# Patient Record
Sex: Female | Born: 1995 | Race: Black or African American | Hispanic: No | Marital: Single | Smoking: Never smoker
Health system: Southern US, Community
[De-identification: ages and names within clinical notes are randomized; demographics above are authoritative.]

---

## 2018-03-02 ENCOUNTER — Encounter (HOSPITAL_COMMUNITY): Payer: Self-pay

## 2018-03-02 ENCOUNTER — Emergency Department (HOSPITAL_COMMUNITY): Payer: Self-pay

## 2018-03-02 ENCOUNTER — Emergency Department (HOSPITAL_COMMUNITY)
Admission: EM | Admit: 2018-03-02 | Discharge: 2018-03-03 | Disposition: A | Discharge status: Home / Self Care | Payer: Self-pay | Attending: Emergency Medicine | Admitting: Emergency Medicine

## 2018-03-02 DIAGNOSIS — R0602 Shortness of breath: Secondary | ICD-10-CM | POA: Insufficient documentation

## 2018-03-02 LAB — CBC WITH DIFFERENTIAL/PLATELET
Abs Immature Granulocytes: 0.05 10*3/uL (ref 0.00–0.07)
Basophils Absolute: 0.1 10*3/uL (ref 0.0–0.1)
Basophils Relative: 0 %
Eosinophils Absolute: 0 10*3/uL (ref 0.0–0.5)
Eosinophils Relative: 0 %
HCT: 41.2 % (ref 36.0–46.0)
Hemoglobin: 13.5 g/dL (ref 12.0–15.0)
Immature Granulocytes: 0 %
Lymphocytes Relative: 21 %
Lymphs Abs: 2.4 10*3/uL (ref 0.7–4.0)
MCH: 29.5 pg (ref 26.0–34.0)
MCHC: 32.8 g/dL (ref 30.0–36.0)
MCV: 90.2 fL (ref 80.0–100.0)
MONO ABS: 0.6 10*3/uL (ref 0.1–1.0)
MONOS PCT: 6 %
Neutro Abs: 8.1 10*3/uL — ABNORMAL HIGH (ref 1.7–7.7)
Neutrophils Relative %: 73 %
Platelets: 194 10*3/uL (ref 150–400)
RBC: 4.57 MIL/uL (ref 3.87–5.11)
RDW: 12.1 % (ref 11.5–15.5)
WBC: 11.2 10*3/uL — ABNORMAL HIGH (ref 4.0–10.5)
nRBC: 0 % (ref 0.0–0.2)

## 2018-03-02 LAB — D-DIMER, QUANTITATIVE: D-Dimer, Quant: 1.51 ug/mL-FEU — ABNORMAL HIGH (ref 0.00–0.50)

## 2018-03-02 LAB — POCT I-STAT TROPONIN I: Troponin i, poc: 0 ng/mL (ref 0.00–0.08)

## 2018-03-02 LAB — I-STAT BETA HCG BLOOD, ED (NOT ORDERABLE): I-stat hCG, quantitative: <5 m[IU]/mL (ref <5)

## 2018-03-02 MED ORDER — ALBUTEROL SULFATE (2.5 MG/3ML) 0.083% IN NEBU
5.0000 mg | INHALATION_SOLUTION | Freq: Once | RESPIRATORY_TRACT | Status: AC
  Administered 2018-03-02: 5 mg via RESPIRATORY_TRACT
  Filled 2018-03-02: qty 6

## 2018-03-02 NOTE — ED Provider Notes (Signed)
[facility]-EMERGENCY DEPT Provider Note   CSN: [id number] Arrival date & time: 03/02/18  2110     History   Chief Complaint No chief complaint on file.   HPI Nicole Herring is a 23 y.o. female.  The history is provided by the patient and medical records.     23 year old female with no significant past medical history presenting to the ED with shortness of breath.  States this has been intermittent for a few days now but worse today while she was at work.  States she just feels like she cannot catch her breath and begins pain which makes her very nervous.  She did have some hot flashes when this occurred today but has not had any noted fever.  She denies any cough, nasal congestion, sore throat, or other URI symptoms.  She denies any chest pain.  She has no known cardiac history.  She is not a smoker.  Patient does report donating plasma today, she has been doing this on a weekly basis for the past several weeks and has never had any issues.  She does feel like her shortness of breath is a little worse today after donating.  History reviewed. No pertinent past medical history.  There are no active problems to display for this patient.   History reviewed. No pertinent surgical history.   OB History   No obstetric history on file.      Home Medications    Prior to Admission medications   Not on File    Family History History reviewed. No pertinent family history.  Social History Social History   Tobacco Use  . Smoking status: Never Smoker  . Smokeless tobacco: Never Used  Substance Use Topics  . Alcohol use: Never    Frequency: Never  . Drug use: Never     Allergies   Patient has no allergy information on record.   Review of Systems Review of Systems  Respiratory: Positive for shortness of breath.   All other systems reviewed and are negative.    Physical Exam Updated Vital Signs BP 121/85 (BP Location: Left Arm)   Pulse  (!) 110   Temp 97.6 F (36.4 C) (Oral)   Resp 20   LMP 02/11/2018   SpO2 100%   Physical Exam Vitals signs and nursing note reviewed.  Constitutional:      Appearance: She is well-developed.  HENT:     Head: Normocephalic and atraumatic.  Eyes:     Conjunctiva/sclera: Conjunctivae normal.     Pupils: Pupils are equal, round, and reactive to light.  Neck:     Musculoskeletal: Normal range of motion.  Cardiovascular:     Rate and Rhythm: Normal rate and regular rhythm.     Heart sounds: Normal heart sounds.  Pulmonary:     Effort: Pulmonary effort is normal.     Breath sounds: Normal breath sounds. No decreased breath sounds, wheezing or rhonchi.  Abdominal:     General: Bowel sounds are normal.     Palpations: Abdomen is soft.  Musculoskeletal: Normal range of motion.  Skin:    General: Skin is warm and dry.  Neurological:     Mental Status: She is alert and oriented to person, place, and time.      ED Treatments / Results  Labs (all labs ordered are listed, but only abnormal results are displayed) Labs Reviewed  CBC WITH DIFFERENTIAL/PLATELET - Abnormal; Notable for the following components:      Result  Value   WBC 11.2 (*)    Neutro Abs 8.1 (*)    All other components within normal limits  BASIC METABOLIC PANEL - Abnormal; Notable for the following components:   Glucose, Bld 108 (*)    Calcium 8.5 (*)    All other components within normal limits  D-DIMER, QUANTITATIVE (NOT AT [facility]) - Abnormal; Notable for the following components:   D-Dimer, Quant 1.51 (*)    All other components within normal limits  I-STAT BETA HCG BLOOD, ED ([facility], [facility], [facility] ONLY)  I-STAT TROPONIN, ED  I-STAT BETA HCG BLOOD, ED (NOT ORDERABLE)  POCT I-STAT TROPONIN I    EKG None  Radiology Dg Chest 2 View  Result Date: 03/02/2018 CLINICAL DATA:  Shortness of breath.  Nonsmoker. EXAM: CHEST - 2 VIEW COMPARISON:  None. FINDINGS: The heart size and mediastinal contours are within normal  limits. Both lungs are clear. The visualized skeletal structures are unremarkable. IMPRESSION: No active cardiopulmonary disease. Electronically Signed   By: Burman NievesWilliam  Stevens M.D.   On: 03/02/2018 22:05   Ct Angio Chest Pe W And/or Wo Contrast  Result Date: 03/03/2018 CLINICAL DATA:  Shortness of breath tonight. Elevated D-dimer. Tachycardia. EXAM: CT ANGIOGRAPHY CHEST WITH CONTRAST TECHNIQUE: Multidetector CT imaging of the chest was performed using the standard protocol during bolus administration of intravenous contrast. Multiplanar CT image reconstructions and MIPs were obtained to evaluate the vascular anatomy. CONTRAST:  100mL ISOVUE-370 IOPAMIDOL (ISOVUE-370) INJECTION 76% COMPARISON:  None. FINDINGS: Cardiovascular: Good opacification of the central and segmental pulmonary arteries. No focal filling defects. No evidence of significant pulmonary embolus. Normal caliber thoracic aorta. No evidence of aortic dissection. Great vessel origins are patent. Normal heart size. No pericardial effusions. Mediastinum/Nodes: Esophagus is decompressed. No significant lymphadenopathy in the chest. Lungs/Pleura: Lungs are clear. No pleural effusion or pneumothorax. Upper Abdomen: No acute abnormalities. Musculoskeletal: No chest wall abnormality. No acute or significant osseous findings. Review of the MIP images confirms the above findings. IMPRESSION: 1. No evidence of significant pulmonary embolus. 2. No evidence of active pulmonary disease. Electronically Signed   By: Burman NievesWilliam  Stevens M.D.   On: 03/03/2018 01:24    Procedures Procedures (including critical care time)  Medications Ordered in ED Medications  sodium chloride (PF) 0.9 % injection (has no administration in time range)  iopamidol (ISOVUE-370) 76 % injection (has no administration in time range)  albuterol (PROVENTIL) (2.5 MG/3ML) 0.083% nebulizer solution 5 mg (5 mg Nebulization Given 03/02/18 2145)  iopamidol (ISOVUE-370) 76 % injection 100 mL  (100 mLs Intravenous Contrast Given 03/03/18 0053)     Initial Impression / Assessment and Plan / ED Course  I have reviewed the triage vital signs and the nursing notes.  Pertinent labs & imaging results that were available during my care of the patient were reviewed by me and considered in my medical decision making (see chart for details).  23 year old female here with shortness of breath.  Has been intermittent for a few days but worse today after donating plasma.  She has been donating on a weekly basis for a few weeks now but has not had any issues until now.  She is afebrile and nontoxic.  She is tachycardic on my exam but did just receive albuterol in triage.  She denies any chest pain here.  EKG with sinus tachycardia.  Chest x-ray was obtained and is negative.  Suspect this may be related to her plasma donation today.  Screening labs pending, will add d-dimer and troponin given  tachycardia as symptoms reportedly present prior to plasma donation but worse after.  Labs as above--CBC unremarkable.  Troponin is negative.  No electrolyte disturbance.  D-dimer is positive at 1.51.  CTA obtained and is negative for acute PE.  Patient still with intermittent tachycardia in the room but denies any current shortness of breath.  Remains without any chest pain.  Symptoms not concerning for ACS.  Feel she is stable for discharge home to follow-up closely with primary care doctor.  She can return here for any new or worsening symptoms.  Final Clinical Impressions(s) / ED Diagnoses   Final diagnoses:  Shortness of breath    ED Discharge Orders    None       Garlon Hatchet, PA-C 03/03/18 0226    Jacalyn Lefevre, MD 03/03/18 [phone]

## 2018-03-02 NOTE — ED Triage Notes (Signed)
Pt complains of being short of breath after getting to work tonight, she states she gave plasma yesterday and felt a little short of breath then but it went away  Pt denies an URI or coughing

## 2018-03-03 ENCOUNTER — Emergency Department (HOSPITAL_COMMUNITY): Payer: Self-pay

## 2018-03-03 ENCOUNTER — Encounter (HOSPITAL_COMMUNITY): Payer: Self-pay

## 2018-03-03 LAB — BASIC METABOLIC PANEL
Anion gap: 8 (ref 5–15)
BUN: 16 mg/dL (ref 6–20)
CALCIUM: 8.5 mg/dL — AB (ref 8.9–10.3)
CO2: 22 mmol/L (ref 22–32)
CREATININE: 0.81 mg/dL (ref 0.44–1.00)
Chloride: 110 mmol/L (ref 98–111)
GFR calc Af Amer: >60 mL/min (ref >60)
GFR calc non Af Amer: >60 mL/min (ref >60)
Glucose, Bld: 108 mg/dL — ABNORMAL HIGH (ref 70–99)
Potassium: 3.5 mmol/L (ref 3.5–5.1)
Sodium: 140 mmol/L (ref 135–145)

## 2018-03-03 MED ORDER — IOPAMIDOL (ISOVUE-370) INJECTION 76%
INTRAVENOUS | Status: AC
  Filled 2018-03-03: qty 100

## 2018-03-03 MED ORDER — IOPAMIDOL (ISOVUE-370) INJECTION 76%
100.0000 mL | Freq: Once | INTRAVENOUS | Status: AC | PRN
  Administered 2018-03-03: 100 mL via INTRAVENOUS

## 2018-03-03 MED ORDER — SODIUM CHLORIDE (PF) 0.9 % IJ SOLN
INTRAMUSCULAR | Status: AC
  Filled 2018-03-03: qty 50

## 2018-03-03 NOTE — Discharge Instructions (Signed)
Testing here looks normal-- no signs of infection, blood clot, and heart looks good. Follow-up with your primary care doctor. Return here for any new/acute changes.

## 2018-11-08 ENCOUNTER — Ambulatory Visit (HOSPITAL_COMMUNITY)
Admission: EM | Admit: 2018-11-08 | Discharge: 2018-11-08 | Disposition: A | Discharge status: Home / Self Care | Payer: Self-pay | Attending: Family Medicine | Admitting: Family Medicine

## 2018-11-08 ENCOUNTER — Encounter (HOSPITAL_COMMUNITY): Payer: Self-pay

## 2018-11-08 ENCOUNTER — Other Ambulatory Visit: Payer: Self-pay

## 2018-11-08 DIAGNOSIS — B309 Viral conjunctivitis, unspecified: Secondary | ICD-10-CM | POA: Insufficient documentation

## 2018-11-08 DIAGNOSIS — N898 Other specified noninflammatory disorders of vagina: Secondary | ICD-10-CM | POA: Insufficient documentation

## 2018-11-08 LAB — POCT URINALYSIS DIP (DEVICE)
Bilirubin Urine: NEGATIVE
Glucose, UA: NEGATIVE mg/dL
Ketones, ur: NEGATIVE mg/dL
Nitrite: NEGATIVE
Protein, ur: NEGATIVE mg/dL
Specific Gravity, Urine: 1.02 (ref 1.005–1.030)
Urobilinogen, UA: 0.2 mg/dL (ref 0.0–1.0)
pH: 6 (ref 5.0–8.0)

## 2018-11-08 MED ORDER — FLUCONAZOLE 150 MG PO TABS: 150.0000 mg | ORAL_TABLET | Freq: Every day | ORAL | 0 refills | Status: DC

## 2018-11-08 MED ORDER — METRONIDAZOLE 500 MG PO TABS: 500.0000 mg | ORAL_TABLET | Freq: Two times a day (BID) | ORAL | 0 refills | Status: DC

## 2018-11-08 NOTE — ED Provider Notes (Addendum)
[facility]    CSN: [id number] Arrival date & time: 11/08/18  1914      History   Chief Complaint Chief Complaint  Patient presents with  . Eye Problem  . Vaginal Discharge  . Exposure to STD    HPI Nicole Herring is a 23 y.o. female.   Patient is a 23 year old female that presents today for multiple complaints.  First complaint being redness and discharge to right eye for the past couple days.  Symptoms have been constant.  She was using some over-the-counter eyedrops for relief which helped some.  No injury to the eye or foreign body.  Denies using contacts.  No trouble with vision or blurred vision. No fever.  She is also having vaginal discharge.  Symptoms been constant x 3 days.  She is seeing some blood in her urine.  Denies any dysuria, urinary frequency urgency.  Denies any vaginal itching or irritation.  History of bacterial vaginosis.  Describes the discharge as white with odor.  She is currently sexually active with one partner, unprotected.  Currently not on birth control.  Was previously on oral contraceptives.  Last menstrual period was the end of September.  Would like to be checked for STDs.  ROS per HPI    Eye Problem Vaginal Discharge Exposure to STD    History reviewed. No pertinent past medical history.  There are no active problems to display for this patient.   History reviewed. No pertinent surgical history.  OB History   No obstetric history on file.      Home Medications    Prior to Admission medications   Medication Sig Start Date End Date Taking? Authorizing Provider  etonogestrel-ethinyl estradiol (NUVARING) 0.12-0.015 MG/24HR vaginal ring Place 1 each vaginally every 28 (twenty-eight) days. Insert vaginally and leave in place for 3 consecutive weeks, then remove for 1 week.    [provider]  fluconazole (DIFLUCAN) 150 MG tablet Take 1 tablet (150 mg total) by mouth daily. 11/08/18   Loura Halt A, NP   metroNIDAZOLE (FLAGYL) 500 MG tablet Take 1 tablet (500 mg total) by mouth 2 (two) times daily. 11/08/18   Orvan July, NP    Family History History reviewed. No pertinent family history.  Social History Social History   Tobacco Use  . Smoking status: Never Smoker  . Smokeless tobacco: Never Used  Substance Use Topics  . Alcohol use: Never    Frequency: Never  . Drug use: Never     Allergies   Patient has no known allergies.   Review of Systems Review of Systems  Genitourinary: Positive for vaginal discharge.     Physical Exam Triage Vital Signs ED Triage Vitals  Enc Vitals Group     BP 11/08/18 0844 117/75     Pulse Rate 11/08/18 0844 80     Resp 11/08/18 0844 14     Temp 11/08/18 0844 98.6 F (37 C)     Temp Source 11/08/18 0844 Oral     SpO2 11/08/18 0844 100 %     Weight --      Height --      Head Circumference --      Peak Flow --      Pain Score 11/08/18 0841 0     Pain Loc --      Pain Edu? --      Excl. in [place]? --    No data found.  Updated Vital Signs BP 117/75 (BP Location:  Right Arm)   Pulse 80   Temp 98.6 F (37 C) (Oral)   Resp 14   SpO2 100%   Visual Acuity Right Eye Distance:   Left Eye Distance:   Bilateral Distance:    Right Eye Near:   Left Eye Near:    Bilateral Near:     Physical Exam Vitals signs and nursing note reviewed.  Constitutional:      General: She is not in acute distress.    Appearance: Normal appearance. She is not ill-appearing, toxic-appearing or diaphoretic.  HENT:     Head: Normocephalic.     Nose: Nose normal.     Mouth/Throat:     Pharynx: Oropharynx is clear.  Eyes:     General:        Right eye: Discharge present.     Extraocular Movements: Extraocular movements intact.     Conjunctiva/sclera:     Right eye: Right conjunctiva is injected. No chemosis, exudate or hemorrhage.    Pupils: Pupils are equal, round, and reactive to light.  Neck:     Musculoskeletal: Normal range of motion.   Pulmonary:     Effort: Pulmonary effort is normal.  Abdominal:     Palpations: Abdomen is soft.     Tenderness: There is no abdominal tenderness.  Musculoskeletal: Normal range of motion.  Skin:    General: Skin is warm and dry.     Findings: No rash.  Neurological:     Mental Status: She is alert.  Psychiatric:        Mood and Affect: Mood normal.      UC Treatments / Results  Labs (all labs ordered are listed, but only abnormal results are displayed) Labs Reviewed  POCT URINALYSIS DIP (DEVICE) - Abnormal; Notable for the following components:      Result Value   Hgb urine dipstick TRACE (*)    Leukocytes,Ua SMALL (*)    All other components within normal limits  URINE CULTURE  CERVICOVAGINAL ANCILLARY ONLY    EKG   Radiology No results found.  Procedures Procedures (including critical care time)  Medications Ordered in UC Medications - No data to display  Initial Impression / Assessment and Plan / UC Course  I have reviewed the triage vital signs and the nursing notes.  Pertinent labs & imaging results that were available during my care of the patient were reviewed by me and considered in my medical decision making (see chart for details).     Vaginal discharge with odor-treating for bacterial vaginosis based on symptoms and history.  Patient requesting Diflucan due to yeast infection after taking antibiotics. Urine with trace leuks and blood.  Sending for culture.  Viral conjunctivitis-over-the-counter symptomatic treatment as needed.  Precautions given about being contagious.  Recommended washing pillowcase. Follow up as needed for continued or worsening symptoms  Final Clinical Impressions(s) / UC Diagnoses   Final diagnoses:  Viral conjunctivitis  Vaginal discharge     Discharge Instructions     I believe that the eye redness is viral conjunctivitis- you can take over the counter eye drops for symptoms relief. Should resolve in 7- 10 days. Be  aware this is contagious. Wash your pillow cases   Sending a vaginal swab for testing. Will treat for BV and yeast infection. You can take the yeast medication after the BV course is finished.   Your urine had bacteria and blood We will send for culture.   Follow up as needed for continued or worsening  symptoms     ED Prescriptions    Medication Sig Dispense Auth. Provider   metroNIDAZOLE (FLAGYL) 500 MG tablet Take 1 tablet (500 mg total) by mouth 2 (two) times daily. 14 tablet Jermey Closs A, NP   fluconazole (DIFLUCAN) 150 MG tablet Take 1 tablet (150 mg total) by mouth daily. 2 tablet Dahlia ByesBast, Miguelina Fore A, NP     PDMP not reviewed this encounter.   Janace ArisBast, Nazyia Gaugh A, NP 11/08/18 1002    Dahlia ByesBast, Jahnaya Branscome A, NP 11/08/18 1002

## 2018-11-08 NOTE — ED Triage Notes (Signed)
Pt reports white vaginal discharge x 3 days, right  pink eye x 2 days with drainage in the mornings.

## 2018-11-08 NOTE — Discharge Instructions (Addendum)
I believe that the eye redness is viral conjunctivitis- you can take over the counter eye drops for symptoms relief. Should resolve in 7- 10 days. Be aware this is contagious. Wash your pillow cases   Sending a vaginal swab for testing. Will treat for BV and yeast infection. You can take the yeast medication after the BV course is finished.   Your urine had bacteria and blood We will send for culture.   Follow up as needed for continued or worsening symptoms

## 2018-11-09 LAB — URINE CULTURE: Culture: NO GROWTH

## 2018-11-11 LAB — CERVICOVAGINAL ANCILLARY ONLY
Bacterial vaginitis: POSITIVE — AB
Candida vaginitis: POSITIVE — AB
Chlamydia: NEGATIVE
Neisseria Gonorrhea: POSITIVE — AB
Trichomonas: NEGATIVE

## 2018-11-13 ENCOUNTER — Ambulatory Visit (HOSPITAL_COMMUNITY)
Admission: EM | Admit: 2018-11-13 | Discharge: 2018-11-13 | Disposition: A | Discharge status: Home / Self Care | Payer: Self-pay | Attending: Emergency Medicine | Admitting: Emergency Medicine

## 2018-11-13 ENCOUNTER — Other Ambulatory Visit: Payer: Self-pay

## 2018-11-13 DIAGNOSIS — Z202 Contact with and (suspected) exposure to infections with a predominantly sexual mode of transmission: Secondary | ICD-10-CM

## 2018-11-13 MED ORDER — CEFTRIAXONE SODIUM 250 MG IJ SOLR
INTRAMUSCULAR | Status: AC
  Filled 2018-11-13: qty 250

## 2018-11-13 MED ORDER — AZITHROMYCIN 250 MG PO TABS
1000.0000 mg | ORAL_TABLET | Freq: Once | ORAL | Status: AC
  Administered 2018-11-13: 16:00:00 1000 mg via ORAL

## 2018-11-13 MED ORDER — CEFTRIAXONE SODIUM 250 MG IJ SOLR
250.0000 mg | Freq: Once | INTRAMUSCULAR | Status: AC
  Administered 2018-11-13: 250 mg via INTRAMUSCULAR

## 2018-11-13 MED ORDER — AZITHROMYCIN 250 MG PO TABS
ORAL_TABLET | ORAL | Status: AC
  Filled 2018-11-13: qty 4

## 2018-11-13 NOTE — ED Triage Notes (Signed)
Pt here for std treatment only 

## 2019-05-14 ENCOUNTER — Encounter (HOSPITAL_COMMUNITY): Payer: Self-pay

## 2019-05-14 ENCOUNTER — Ambulatory Visit (HOSPITAL_COMMUNITY)
Admission: EM | Admit: 2019-05-14 | Discharge: 2019-05-14 | Disposition: A | Discharge status: Home / Self Care | Payer: BLUE CROSS/BLUE SHIELD | Attending: Family Medicine | Admitting: Family Medicine

## 2019-05-14 ENCOUNTER — Other Ambulatory Visit: Payer: Self-pay

## 2019-05-14 DIAGNOSIS — B9689 Other specified bacterial agents as the cause of diseases classified elsewhere: Secondary | ICD-10-CM | POA: Diagnosis not present

## 2019-05-14 DIAGNOSIS — Z3202 Encounter for pregnancy test, result negative: Secondary | ICD-10-CM

## 2019-05-14 DIAGNOSIS — Z113 Encounter for screening for infections with a predominantly sexual mode of transmission: Secondary | ICD-10-CM | POA: Diagnosis not present

## 2019-05-14 DIAGNOSIS — N760 Acute vaginitis: Secondary | ICD-10-CM

## 2019-05-14 DIAGNOSIS — N898 Other specified noninflammatory disorders of vagina: Secondary | ICD-10-CM | POA: Diagnosis not present

## 2019-05-14 LAB — POCT PREGNANCY, URINE: Preg Test, Ur: NEGATIVE

## 2019-05-14 LAB — POC URINE PREG, ED: Preg Test, Ur: NEGATIVE

## 2019-05-14 MED ORDER — METRONIDAZOLE 500 MG PO TABS: 500.0000 mg | ORAL_TABLET | Freq: Two times a day (BID) | ORAL | 0 refills | Status: AC

## 2019-05-14 MED ORDER — FLUCONAZOLE 150 MG PO TABS: 150.0000 mg | ORAL_TABLET | Freq: Once | ORAL | 0 refills | Status: AC

## 2019-05-14 NOTE — ED Provider Notes (Signed)
[facility]    CSN: [id number] Arrival date & time: 05/14/19  1218      History   Chief Complaint Chief Complaint  Patient presents with  . SEXUALLY TRANSMITTED DISEASE    HPI Nicole Herring is a 24 y.o. female no significant past medical history presenting today for STD screening.  Patient notes that recently she has had an odor that has become very strong.  She notes that she had unprotected intercourse approximately 10 days ago.  She has history of BV and yeast.  Last menstrual cycle was around 4/07.  Is not on birth control.  She reports some discharge a couple weeks ago, but no persistent discharge.  Denies abdominal pain, itching or irritation.  She denies abdominal pain nausea or vomiting.  Denies urinary symptoms of dysuria, increased frequency or urgency.  Denies any rashes or lesions.  HPI  History reviewed. No pertinent past medical history.  There are no problems to display for this patient.   History reviewed. No pertinent surgical history.  OB History   No obstetric history on file.      Home Medications    Prior to Admission medications   Medication Sig Start Date End Date Taking? Authorizing Provider  etonogestrel-ethinyl estradiol (NUVARING) 0.12-0.015 MG/24HR vaginal ring Place 1 each vaginally every 28 (twenty-eight) days. Insert vaginally and leave in place for 3 consecutive weeks, then remove for 1 week.    [provider]  fluconazole (DIFLUCAN) 150 MG tablet Take 1 tablet (150 mg total) by mouth once for 1 dose. 05/14/19 05/14/19  Elmond Poehlman C, PA-C  metroNIDAZOLE (FLAGYL) 500 MG tablet Take 1 tablet (500 mg total) by mouth 2 (two) times daily for 7 days. 05/14/19 05/21/19  Audrick Lamoureaux, Elesa Hacker, PA-C    Family History Family History  Problem Relation Age of Onset  . Healthy Mother   . Healthy Father     Social History Social History   Tobacco Use  . Smoking status: Never Smoker  . Smokeless tobacco: Never Used   Substance Use Topics  . Alcohol use: Never  . Drug use: Never     Allergies   Patient has no known allergies.   Review of Systems Review of Systems  Constitutional: Negative for fever.  Respiratory: Negative for shortness of breath.   Cardiovascular: Negative for chest pain.  Gastrointestinal: Negative for abdominal pain, diarrhea, nausea and vomiting.  Genitourinary: Negative for dysuria, flank pain, genital sores, hematuria, menstrual problem, vaginal bleeding, vaginal discharge and vaginal pain.  Musculoskeletal: Negative for back pain.  Skin: Negative for rash.  Neurological: Negative for dizziness, light-headedness and headaches.     Physical Exam Triage Vital Signs ED Triage Vitals  Enc Vitals Group     BP 05/14/19 1319 113/73     Pulse Rate 05/14/19 1319 98     Resp 05/14/19 1319 18     Temp 05/14/19 1319 98.3 F (36.8 C)     Temp src --      SpO2 05/14/19 1319 99 %     Weight --      Height --      Head Circumference --      Peak Flow --      Pain Score 05/14/19 1318 0     Pain Loc --      Pain Edu? --      Excl. in [place]? --    No data found.  Updated Vital Signs BP 113/73 (BP Location: Left Arm)  Pulse 98   Temp 98.3 F (36.8 C)   Resp 18   LMP 05/03/2019   SpO2 99%   Visual Acuity Right Eye Distance:   Left Eye Distance:   Bilateral Distance:    Right Eye Near:   Left Eye Near:    Bilateral Near:     Physical Exam Vitals and nursing note reviewed.  Constitutional:      Appearance: She is well-developed.     Comments: No acute distress  HENT:     Head: Normocephalic and atraumatic.     Nose: Nose normal.  Eyes:     Conjunctiva/sclera: Conjunctivae normal.  Cardiovascular:     Rate and Rhythm: Normal rate.  Pulmonary:     Effort: Pulmonary effort is normal. No respiratory distress.  Abdominal:     General: There is no distension.     Comments: Soft, nondistended, nontender to light and deep palpation throughout abdomen   Genitourinary:    Comments: Normal external female genitalia, no rashes or lesions noted, vaginal mucosa pink, mild amount of white and clear discharge present in vagina, cervix pink Musculoskeletal:        General: Normal range of motion.     Cervical back: Neck supple.  Skin:    General: Skin is warm and dry.  Neurological:     Mental Status: She is alert and oriented to person, place, and time.      UC Treatments / Results  Labs (all labs ordered are listed, but only abnormal results are displayed) Labs Reviewed  POC URINE PREG, ED  POCT PREGNANCY, URINE  CERVICOVAGINAL ANCILLARY ONLY    EKG   Radiology No results found.  Procedures Procedures (including critical care time)  Medications Ordered in UC Medications - No data to display  Initial Impression / Assessment and Plan / UC Course  I have reviewed the triage vital signs and the nursing notes.  Pertinent labs & imaging results that were available during my care of the patient were reviewed by me and considered in my medical decision making (see chart for details).    Pregnancy test negative. Vaginal swab pending to screen for STDs as well as yeast and BV.  We will empirically treat for yeast and BV today with Flagyl and Diflucan.  Will call with results of swab and provide further treatment as needed.  Discussed strict return precautions. Patient verbalized understanding and is agreeable with plan.  Final Clinical Impressions(s) / UC Diagnoses   Final diagnoses:  Screen for STD (sexually transmitted disease)  Vaginal odor     Discharge Instructions     May begin Flagyl twice daily for the next week to treat for BV.  Diflucan 1 today, repeat tablet after completing Flagyl.  We are testing you for Gonorrhea, Chlamydia, Trichomonas, Yeast and Bacterial Vaginosis. We will call you if anything is positive and let you know if you require any further treatment. Please inform partners of any positive results.    Please return if symptoms not improving with treatment, development of fever, nausea, vomiting, abdominal pain.     ED Prescriptions    Medication Sig Dispense Auth. Provider   metroNIDAZOLE (FLAGYL) 500 MG tablet Take 1 tablet (500 mg total) by mouth 2 (two) times daily for 7 days. 14 tablet Lorenna Lurry C, PA-C   fluconazole (DIFLUCAN) 150 MG tablet Take 1 tablet (150 mg total) by mouth once for 1 dose. 2 tablet Zuleica Seith, [place] C, PA-C     PDMP not reviewed  this encounter.   Lew Dawes, PA-C 05/14/19 1359

## 2019-05-14 NOTE — ED Triage Notes (Signed)
Pt c/o bloody d/c from vagina with malodor for approx 1 week. Had unprotected intercourse approx 10 days ago.  Denies dysuria sx, abdom pain, n/v/d, fever, chills.

## 2019-05-14 NOTE — Discharge Instructions (Signed)
May begin Flagyl twice daily for the next week to treat for BV.  Diflucan 1 today, repeat tablet after completing Flagyl.  We are testing you for Gonorrhea, Chlamydia, Trichomonas, Yeast and Bacterial Vaginosis. We will call you if anything is positive and let you know if you require any further treatment. Please inform partners of any positive results.   Please return if symptoms not improving with treatment, development of fever, nausea, vomiting, abdominal pain.

## 2019-05-15 LAB — CERVICOVAGINAL ANCILLARY ONLY
Bacterial Vaginitis (gardnerella): POSITIVE — AB
Candida Glabrata: NEGATIVE
Candida Vaginitis: NEGATIVE
Chlamydia: NEGATIVE
Comment: NEGATIVE
Comment: NEGATIVE
Comment: NEGATIVE
Comment: NEGATIVE
Comment: NEGATIVE
Comment: NORMAL
Neisseria Gonorrhea: NEGATIVE
Trichomonas: NEGATIVE

## 2019-05-26 ENCOUNTER — Ambulatory Visit: Payer: BLUE CROSS/BLUE SHIELD | Attending: Internal Medicine

## 2019-05-26 DIAGNOSIS — Z23 Encounter for immunization: Secondary | ICD-10-CM

## 2019-05-26 NOTE — Progress Notes (Signed)
   Covid-19 Vaccination Clinic  Name:  Nicole Herring    MRN: [id number] DOB: 01-08-1996  05/26/2019  Nicole Herring was observed post Covid-19 immunization for 15 minutes without incident. She was provided with Vaccine Information Sheet and instruction to access the V-Safe system.   Nicole Herring was instructed to call 911 with any severe reactions post vaccine: Marland Kitchen Difficulty breathing  . Swelling of face and throat  . A fast heartbeat  . A bad rash all over body  . Dizziness and weakness   Immunizations Administered    Name Date Dose VIS Date Route   Pfizer COVID-19 Vaccine 05/26/2019 11:24 AM 0.3 mL 03/22/2018 Intramuscular   Manufacturer: [employer], [employer]   Lot: [id number]   NDC: [address]-0

## 2019-06-19 ENCOUNTER — Ambulatory Visit: Payer: BLUE CROSS/BLUE SHIELD

## 2020-08-30 IMAGING — CT CT ANGIO CHEST
2 of 6 series · 19 of 46 positions shown · IV contrast (ISOVUE 370)
Comparison: None.

CLINICAL DATA: Shortness of breath tonight. Elevated D-dimer.
Tachycardia.

EXAM:
CT ANGIOGRAPHY CHEST WITH CONTRAST
TECHNIQUE: Multidetector CT imaging of the chest was performed using the
standard protocol during bolus administration of intravenous
contrast. Multiplanar CT image reconstructions and MIPs were
obtained to evaluate the vascular anatomy.
CONTRAST:  100mL [id number] IOPAMIDOL ([id number]) INJECTION 76%

[Series 5: thins · axial · 0.62mm/px · z∈[-89,+139]mm · 16 of 250 slices shown]
[im 11/250  lung]
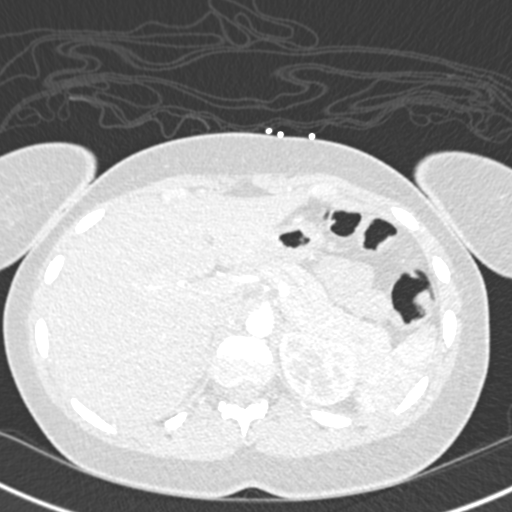
[im 33/250  soft-tissue]
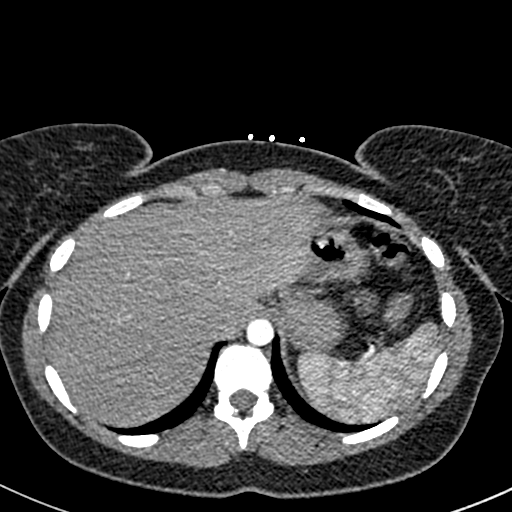
[im 44/250  lung]
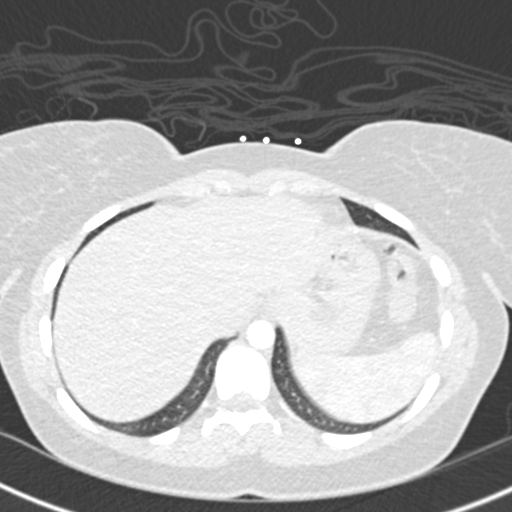
[im 55/250  soft-tissue]
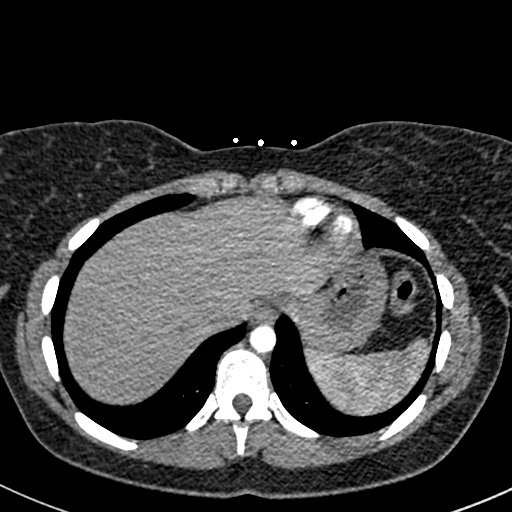
[im 76/250  lung]
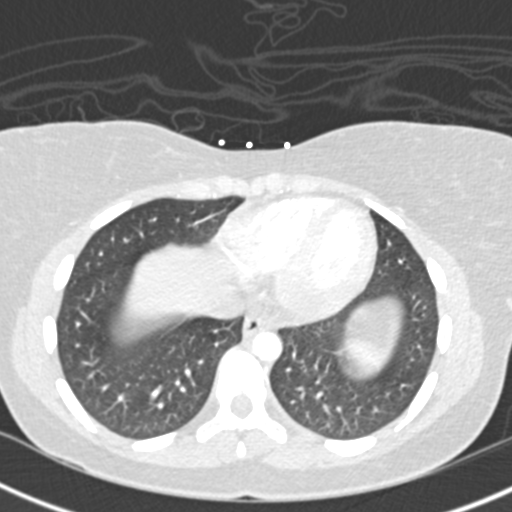
[im 87/250  soft-tissue]
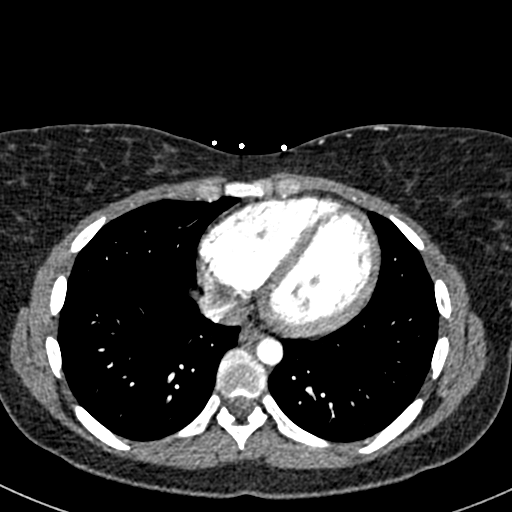
[im 98/250  lung]
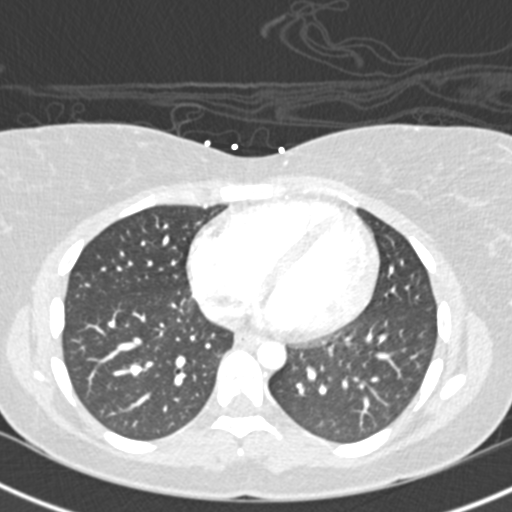
[im 120/250  soft-tissue]
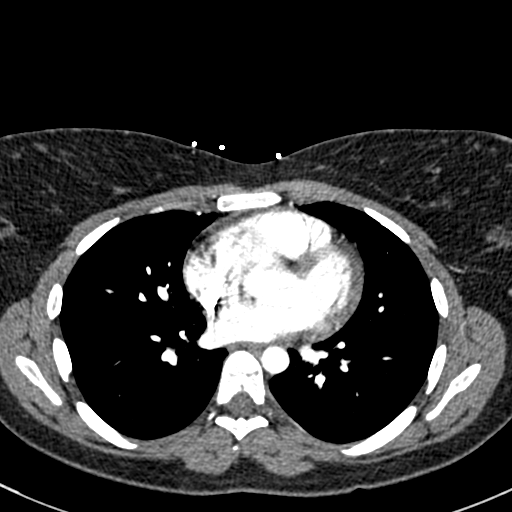
[im 130/250  lung]
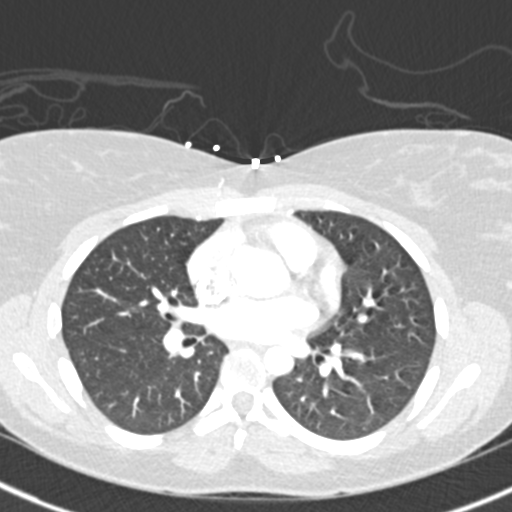
[im 152/250  soft-tissue]
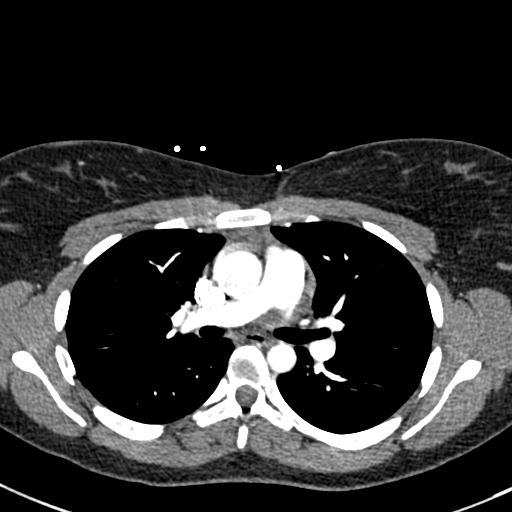
[im 163/250  lung]
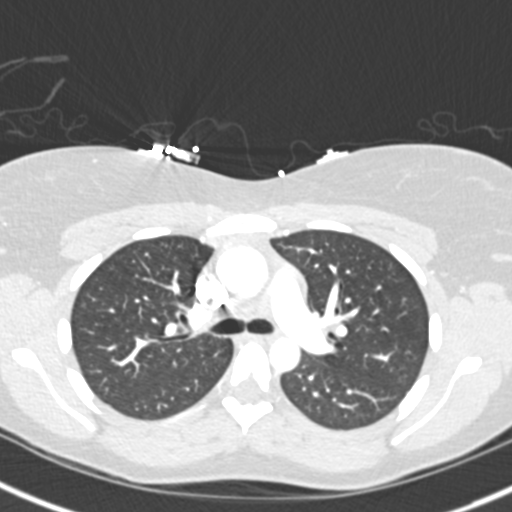
[im 174/250  soft-tissue]
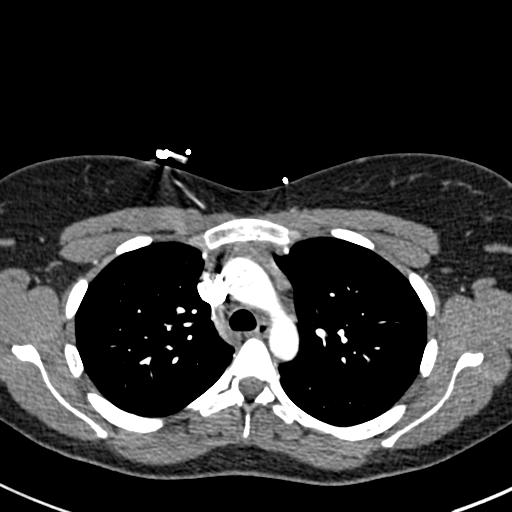
[im 195/250  lung]
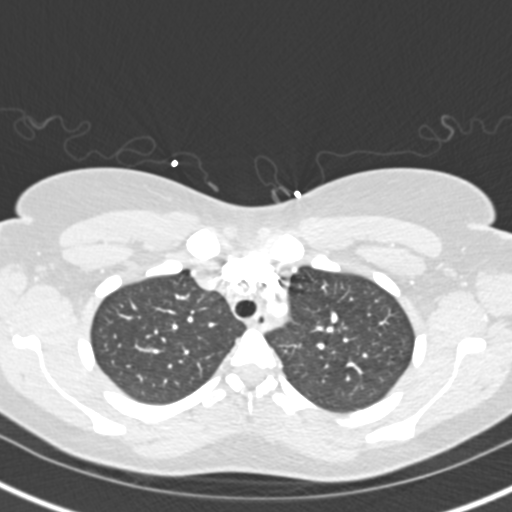
[im 206/250  soft-tissue]
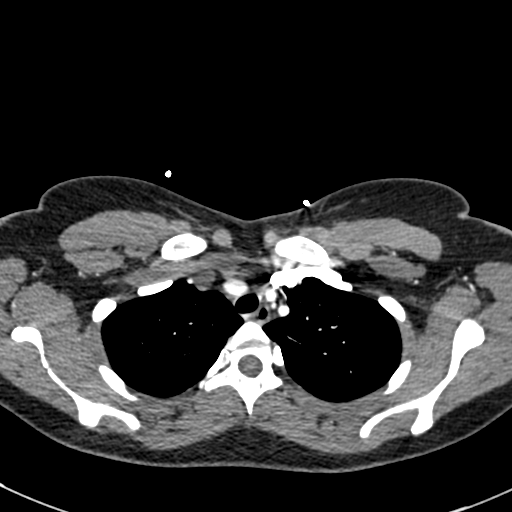
[im 217/250  lung]
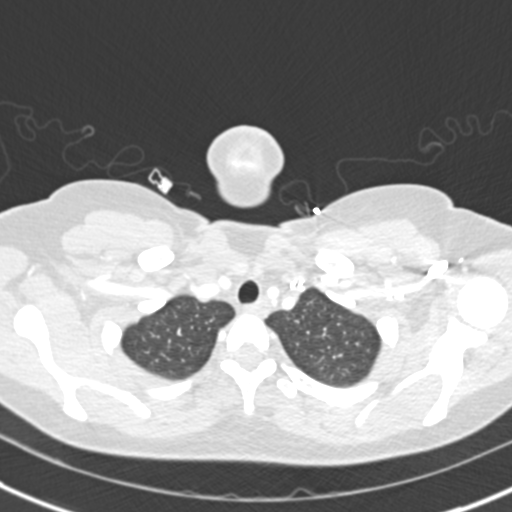
[im 239/250  soft-tissue]
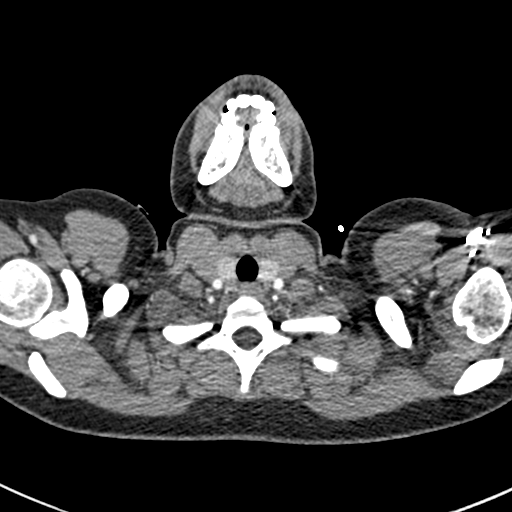

[Series 7: coronal mpr · coronal · 0.49mm/px · 3 of 99 slices shown]
[im 25/99  soft-tissue]
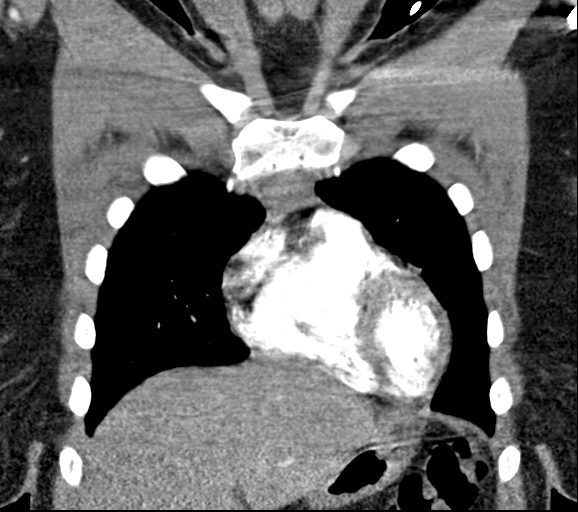
[im 50/99  soft-tissue]
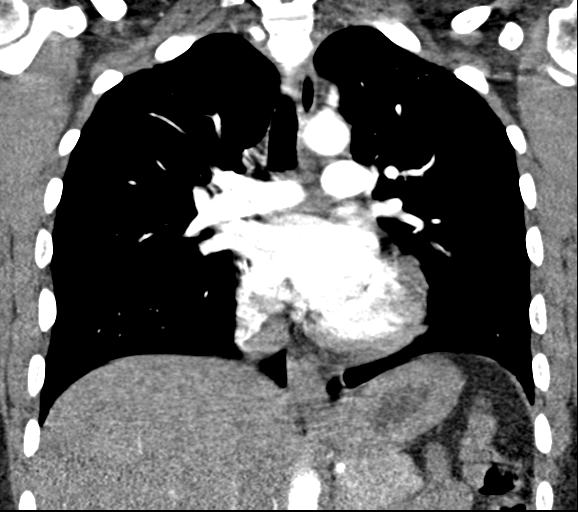
[im 74/99  soft-tissue]
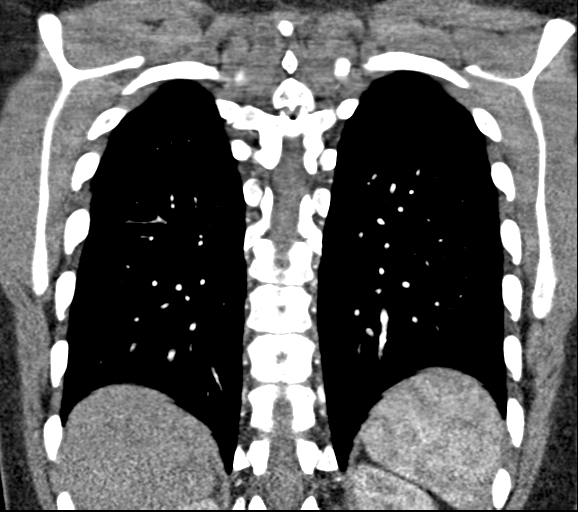

[19 of 46 positions shown; findings below may reference images not displayed]

FINDINGS: Cardiovascular: Good opacification of the central and segmental
pulmonary arteries. No focal filling defects. No evidence of
significant pulmonary embolus. Normal caliber thoracic aorta. No
evidence of aortic dissection. Great vessel origins are patent.
Normal heart size. No pericardial effusions.

Mediastinum/Nodes: Esophagus is decompressed. No significant
lymphadenopathy in the chest.

Lungs/Pleura: Lungs are clear. No pleural effusion or pneumothorax.

Upper Abdomen: No acute abnormalities.

Musculoskeletal: No chest wall abnormality. No acute or significant
osseous findings.

Review of the MIP images confirms the above findings.
IMPRESSION: 1. No evidence of significant pulmonary embolus.
2. No evidence of active pulmonary disease.
